# Patient Record
Sex: Female | Born: 1955 | Race: White | Hispanic: No | Marital: Married | Smoking: Never smoker
Health system: Southern US, Community
[De-identification: ages and names within clinical notes are randomized; demographics above are authoritative.]

## PROBLEM LIST (undated history)

## (undated) DIAGNOSIS — I10 Essential (primary) hypertension: Secondary | ICD-10-CM

## (undated) DIAGNOSIS — E119 Type 2 diabetes mellitus without complications: Secondary | ICD-10-CM

---

## 2019-11-29 ENCOUNTER — Telehealth: Payer: Self-pay | Admitting: Family Medicine

## 2019-11-29 ENCOUNTER — Emergency Department (INDEPENDENT_AMBULATORY_CARE_PROVIDER_SITE_OTHER)
Admission: EM | Admit: 2019-11-29 | Discharge: 2019-11-29 | Disposition: A | Discharge status: Home / Self Care | Payer: HRSA Program | Source: Home / Self Care

## 2019-11-29 ENCOUNTER — Other Ambulatory Visit: Payer: Self-pay

## 2019-11-29 ENCOUNTER — Emergency Department
Admission: RE | Admit: 2019-11-29 | Discharge: 2019-11-29 | Disposition: A | Discharge status: Home / Self Care | Payer: Self-pay | Source: Ambulatory Visit

## 2019-11-29 DIAGNOSIS — U071 COVID-19: Secondary | ICD-10-CM

## 2019-11-29 DIAGNOSIS — J4520 Mild intermittent asthma, uncomplicated: Secondary | ICD-10-CM

## 2019-11-29 MED ORDER — ALBUTEROL SULFATE (2.5 MG/3ML) 0.083% IN NEBU: 2.5000 mg | INHALATION_SOLUTION | Freq: Four times a day (QID) | RESPIRATORY_TRACT | 12 refills | Status: AC | PRN

## 2019-11-29 MED ORDER — ALBUTEROL SULFATE (5 MG/ML) 0.5% IN NEBU: 2.5000 mg | INHALATION_SOLUTION | Freq: Four times a day (QID) | RESPIRATORY_TRACT | 12 refills | Status: DC | PRN

## 2019-11-29 MED ORDER — PROMETHAZINE-CODEINE 6.25-10 MG/5ML PO SYRP: 5.0000 mL | ORAL_SOLUTION | Freq: Four times a day (QID) | ORAL | 0 refills | Status: DC | PRN

## 2019-11-29 MED ORDER — PROMETHAZINE-CODEINE 6.25-10 MG/5ML PO SYRP: 5.0000 mL | ORAL_SOLUTION | Freq: Four times a day (QID) | ORAL | 0 refills | Status: AC | PRN

## 2019-11-29 MED ORDER — DEXAMETHASONE SODIUM PHOSPHATE 10 MG/ML IJ SOLN
10.0000 mg | Freq: Once | INTRAMUSCULAR | Status: AC
  Administered 2019-11-29: 10 mg via INTRAMUSCULAR

## 2019-11-29 NOTE — Telephone Encounter (Signed)
Reordered albuterol in ampules initially ordered as bottle.

## 2019-11-29 NOTE — Discharge Instructions (Addendum)
You will be contacted by someone at the [facility] health infusion clinic regarding setting for your infusion therapy. In the meantime continue to monitor your oxygen level at home as we discussed if your level drops below 89 consistently this is a medical emergency and he should go immediately to the emergency department.  I sent over medication to you help with cough during the day you can take Delsym.  Hydrate and perform deep breathing exercises are the best way to prevent lung related complications such as pneumonia related to Covid. Recommendation management of viral illness include: Vitamin D 5,000 IU daily Vitamin C 500 mg twice daily Zinc 50 mg daily

## 2019-11-29 NOTE — ED Provider Notes (Addendum)
[facility]    CSN: [id number] Arrival date & time: 11/29/19  1439      History   Chief Complaint Chief Complaint  Patient presents with  . Covid Positive  . Shortness of Breath  . Cough    HPI Candice Clay is a 64 y.o. female.   HPI Patient presents with an active COVID-19 infection presents for evaluation of worsening cough, shortness of breath, and active history of asthma.  She has used her albuterol inhaler frequently with some improvement in work of breathing. However, symptoms have persisted. She is high risk for complications related to COVID-19 due to weight, diabetes, and cardiovascular disease. She experienced low grade fever. Able tolerate oral intake of food and fluids.History reviewed. No pertinent past medical history.  There are no problems to display for this patient.   History reviewed. No pertinent surgical history.  OB History   No obstetric history on file.      Home Medications    Prior to Admission medications   Medication Sig Start Date End Date Taking? Authorizing Provider  metFORMIN (GLUCOPHAGE) 500 MG tablet Take 1 tablet by mouth 2 (two) times daily with a meal. 06/21/17  Yes [provider]    Family History History reviewed. No pertinent family history.  Social History Social History   Tobacco Use  . Smoking status: Never Smoker  . Smokeless tobacco: Never Used  Vaping Use  . Vaping Use: Never used  Substance Use Topics  . Alcohol use: Not on file  . Drug use: Not on file     Allergies   Patient has no known allergies.   Review of Systems Review of Systems Pertinent negatives listed in HPI  Physical Exam Triage Vital Signs ED Triage Vitals  Enc Vitals Group     BP 11/29/19 1458 (!) 147/91     Pulse Rate 11/29/19 1458 97     Resp 11/29/19 1458 17     Temp 11/29/19 1458 99.3 F (37.4 C)     Temp Source 11/29/19 1458 Oral     SpO2 11/29/19 1458 95 %     Weight 11/29/19 1459 236 lb (107 kg)      Height 11/29/19 1459 5\' 5"  (1.651 m)     Head Circumference --      Peak Flow --      Pain Score 11/29/19 1459 0     Pain Loc --      Pain Edu? --      Excl. in GC? --    No data found.  Updated Vital Signs BP (!) 147/91 (BP Location: Right Arm)   Pulse 97   Temp 99.3 F (37.4 C) (Oral)   Resp 17   Ht 5\' 5"  (1.651 m)   Wt 236 lb (107 kg)   SpO2 95%   BMI 39.27 kg/m   Visual Acuity Right Eye Distance:   Left Eye Distance:   Bilateral Distance:    Right Eye Near:   Left Eye Near:    Bilateral Near:     Physical Exam Constitutional:      Appearance: She is obese.  HENT:     Head: Normocephalic.  Eyes:     Extraocular Movements: Extraocular movements intact.  Cardiovascular:     Rate and Rhythm: Normal rate and regular rhythm.  Pulmonary:     Effort: Prolonged expiration present.     Breath sounds: Normal air entry. Examination of the right-upper field reveals rhonchi. Examination of the  left-upper field reveals rhonchi. Examination of the right-middle field reveals rhonchi. Examination of the left-middle field reveals rhonchi. Rhonchi present.  Neurological:     General: No focal deficit present.     Mental Status: She is alert and oriented to person, place, and time. Mental status is at baseline.  Psychiatric:        Attention and Perception: Attention and perception normal.        Mood and Affect: Mood normal.     UC Treatments / Results  Labs (all labs ordered are listed, but only abnormal results are displayed) Labs Reviewed - No data to display  EKG   Radiology No results found.  Procedures Procedures (including critical care time)  Medications Ordered in UC Medications - No data to display  Initial Impression / Assessment and Plan / UC Course  I have reviewed the triage vital signs and the nursing notes.  Pertinent labs & imaging results that were available during my care of the patient were reviewed by me and considered in my medical  decision making (see chart for details).    Patient referred to the [facility] health infusion clinic given high risk for complications related to Covid. Discussed red flags that warrant immediate evaluation in the ER. Albuterol inhaler provided here in clinic today.  Patient given a written prescription for nebulizer medication as patient is without health insurance has not had the medication filled in quite some time.. Encouraged hydration and deep breathing to prevent pneumonia.  Promethazine DM prescribed for cough.  Patient verbalized understanding and agreement with plan. Nebulizer inhaler dispensed here and printed prescription for albuterol inhaler. Final Clinical Impressions(s) / UC Diagnoses   Final diagnoses:  COVID-19 virus infection  Mild intermittent asthma without complication     Discharge Instructions     You will be contacted by someone at the [facility] health infusion clinic regarding setting for your infusion therapy. In the meantime continue to monitor your oxygen level at home as we discussed if your level drops below 89 consistently this is a medical emergency and he should go immediately to the emergency department.  I sent over medication to you help with cough during the day you can take Delsym.  Hydrate and perform deep breathing exercises are the best way to prevent lung related complications such as pneumonia related to Covid. Recommendation management of viral illness include: Vitamin D 5,000 IU daily Vitamin C 500 mg twice daily Zinc 50 mg daily     ED Prescriptions    Medication Sig Dispense Auth. Provider   promethazine-codeine (PHENERGAN WITH CODEINE) 6.25-10 MG/5ML syrup  (Status: Discontinued) Take 5 mLs by mouth every 6 (six) hours as needed for cough. 120 mL Bing Neighbors, FNP   promethazine-codeine (PHENERGAN WITH CODEINE) 6.25-10 MG/5ML syrup Take 5 mLs by mouth every 6 (six) hours as needed for cough. 120 mL Bing Neighbors, FNP   albuterol  (PROVENTIL) (5 MG/ML) 0.5% nebulizer solution Take 0.5 mLs (2.5 mg total) by nebulization every 6 (six) hours as needed for wheezing or shortness of breath. 20 mL Bing Neighbors, FNP     PDMP not reviewed this encounter.   Bing Neighbors, FNP 11/29/19 1847    Bing Neighbors, FNP 11/29/19 2246

## 2019-11-29 NOTE — ED Triage Notes (Signed)
COVID pos pt here today c/o cough and SOB. Husband pos as of last Monday. Pt sxs started last wed. Pos test on Thurs 9/23. Hx of asthma. Takes ventolin prn. Almost empty.   Has not had COVID vaccinations. Education provided.

## 2019-11-30 ENCOUNTER — Other Ambulatory Visit: Payer: Self-pay | Admitting: Internal Medicine

## 2019-11-30 ENCOUNTER — Ambulatory Visit (HOSPITAL_COMMUNITY)
Admission: RE | Admit: 2019-11-30 | Discharge: 2019-11-30 | Disposition: A | Discharge status: Home / Self Care | Payer: HRSA Program | Source: Ambulatory Visit | Attending: Pulmonary Disease | Admitting: Pulmonary Disease

## 2019-11-30 ENCOUNTER — Telehealth: Payer: Self-pay | Admitting: Internal Medicine

## 2019-11-30 ENCOUNTER — Telehealth: Payer: Self-pay | Admitting: Emergency Medicine

## 2019-11-30 DIAGNOSIS — E119 Type 2 diabetes mellitus without complications: Secondary | ICD-10-CM | POA: Diagnosis present

## 2019-11-30 DIAGNOSIS — U071 COVID-19: Secondary | ICD-10-CM

## 2019-11-30 DIAGNOSIS — J4520 Mild intermittent asthma, uncomplicated: Secondary | ICD-10-CM

## 2019-11-30 MED ORDER — EPINEPHRINE 0.3 MG/0.3ML IJ SOAJ: 0.3000 mg | Freq: Once | INTRAMUSCULAR | Status: DC | PRN

## 2019-11-30 MED ORDER — SODIUM CHLORIDE 0.9 % IV SOLN
1200.0000 mg | Freq: Once | INTRAVENOUS | Status: AC
  Administered 2019-11-30: 1200 mg via INTRAVENOUS

## 2019-11-30 MED ORDER — METHYLPREDNISOLONE SODIUM SUCC 125 MG IJ SOLR: 125.0000 mg | Freq: Once | INTRAMUSCULAR | Status: DC | PRN

## 2019-11-30 MED ORDER — ALBUTEROL SULFATE HFA 108 (90 BASE) MCG/ACT IN AERS: 2.0000 | INHALATION_SPRAY | Freq: Once | RESPIRATORY_TRACT | Status: DC | PRN

## 2019-11-30 MED ORDER — FAMOTIDINE IN NACL 20-0.9 MG/50ML-% IV SOLN: 20.0000 mg | Freq: Once | INTRAVENOUS | Status: DC | PRN

## 2019-11-30 MED ORDER — SODIUM CHLORIDE 0.9 % IV SOLN: INTRAVENOUS | Status: DC | PRN

## 2019-11-30 MED ORDER — DIPHENHYDRAMINE HCL 50 MG/ML IJ SOLN: 50.0000 mg | Freq: Once | INTRAMUSCULAR | Status: DC | PRN

## 2019-11-30 NOTE — Telephone Encounter (Signed)
Call the patient and left a message, asked for a call back to: [phone] to learn more about the infusion and schedule an appointment.

## 2019-11-30 NOTE — Discharge Instructions (Signed)

## 2019-11-30 NOTE — Telephone Encounter (Signed)
I spoke with the patient. Reports a history of asthma and diabetes. Started with symptoms November 22, 2019: She tested positive. Went to urgent care recently, got steroids. Still have symptoms including cough and difficulty breathing. Pro/cons of infusion discussed and she agreed to proceed. Orders are entered, detailed instructions discussed.

## 2019-11-30 NOTE — Telephone Encounter (Signed)
Call back to pharmacy regarding changing nebulizer solution to individual vials. Spoke w/pharmacist. Prescription had been corrected last night electronically & was filled.

## 2019-11-30 NOTE — Progress Notes (Signed)
I connected by phone with Candice Clay on 11/30/2019 at 12:18 PM to discuss the potential use of a new treatment for mild to moderate COVID-19 viral infection in non-hospitalized patients.  This patient is a 64 y.o. female that meets the FDA criteria for Emergency Use Authorization of COVID monoclonal antibody casirivimab/imdevimab or bamlanivimab/eteseviamb.  Has a (+) direct SARS-CoV-2 viral test result  Has mild or moderate COVID-19   Is NOT hospitalized due to COVID-19  Is within 10 days of symptom onset  Has at least one of the high risk factor(s) for progression to severe COVID-19 and/or hospitalization as defined in EUA.  Specific high risk criteria : Older age (>/= 66 yo), Diabetes and Chronic Lung Disease   I have spoken and communicated the following to the patient or parent/caregiver regarding COVID monoclonal antibody treatment:  1. FDA has authorized the emergency use for the treatment of mild to moderate COVID-19 in adults and pediatric patients with positive results of direct SARS-CoV-2 viral testing who are 17 years of age and older weighing at least 40 kg, and who are at high risk for progressing to severe COVID-19 and/or hospitalization.  2. The significant known and potential risks and benefits of COVID monoclonal antibody, and the extent to which such potential risks and benefits are unknown.  3. Information on available alternative treatments and the risks and benefits of those alternatives, including clinical trials.  4. Patients treated with COVID monoclonal antibody should continue to self-isolate and use infection control measures (e.g., wear mask, isolate, social distance, avoid sharing personal items, clean and disinfect "high touch" surfaces, and frequent handwashing) according to CDC guidelines.   5. The patient or parent/caregiver has the option to accept or refuse COVID monoclonal antibody treatment.  After reviewing this information with the patient, the  patient has agreed to receive one of the available covid 19 monoclonal antibodies and will be provided an appropriate fact sheet prior to infusion. Candice Ora, MD 11/30/2019 12:18 PM

## 2019-11-30 NOTE — Progress Notes (Signed)
  Diagnosis: COVID-19  Physician: Dr. Wright  Procedure: Covid Infusion Clinic Med: casirivimab\imdevimab infusion - Provided patient with casirivimab\imdevimab fact sheet for patients, parents and caregivers prior to infusion.  Complications: No immediate complications noted.  Discharge: Discharged home   [name] 11/30/2019  

## 2020-12-22 ENCOUNTER — Other Ambulatory Visit: Payer: Self-pay

## 2020-12-22 ENCOUNTER — Encounter (HOSPITAL_BASED_OUTPATIENT_CLINIC_OR_DEPARTMENT_OTHER): Payer: Self-pay | Admitting: Obstetrics and Gynecology

## 2020-12-22 ENCOUNTER — Emergency Department (HOSPITAL_BASED_OUTPATIENT_CLINIC_OR_DEPARTMENT_OTHER): Payer: Medicare Other

## 2020-12-22 ENCOUNTER — Emergency Department (HOSPITAL_BASED_OUTPATIENT_CLINIC_OR_DEPARTMENT_OTHER)
Admission: EM | Admit: 2020-12-22 | Discharge: 2020-12-22 | Disposition: A | Discharge status: Home / Self Care | Payer: Medicare Other | Attending: Emergency Medicine | Admitting: Emergency Medicine

## 2020-12-22 DIAGNOSIS — Z7984 Long term (current) use of oral hypoglycemic drugs: Secondary | ICD-10-CM | POA: Insufficient documentation

## 2020-12-22 DIAGNOSIS — E119 Type 2 diabetes mellitus without complications: Secondary | ICD-10-CM | POA: Insufficient documentation

## 2020-12-22 DIAGNOSIS — I10 Essential (primary) hypertension: Secondary | ICD-10-CM | POA: Insufficient documentation

## 2020-12-22 DIAGNOSIS — M62838 Other muscle spasm: Secondary | ICD-10-CM

## 2020-12-22 DIAGNOSIS — R519 Headache, unspecified: Secondary | ICD-10-CM | POA: Diagnosis present

## 2020-12-22 HISTORY — DX: Essential (primary) hypertension: I10

## 2020-12-22 HISTORY — DX: Type 2 diabetes mellitus without complications: E11.9

## 2020-12-22 MED ORDER — CYCLOBENZAPRINE HCL 10 MG PO TABS
10.0000 mg | ORAL_TABLET | Freq: Once | ORAL | Status: AC
  Administered 2020-12-22: 10 mg via ORAL
  Filled 2020-12-22: qty 1

## 2020-12-22 MED ORDER — METHYLPREDNISOLONE 4 MG PO TBPK: ORAL_TABLET | ORAL | 0 refills | Status: AC

## 2020-12-22 MED ORDER — CYCLOBENZAPRINE HCL 10 MG PO TABS: 10.0000 mg | ORAL_TABLET | Freq: Two times a day (BID) | ORAL | 0 refills | Status: AC | PRN

## 2020-12-22 MED ORDER — KETOROLAC TROMETHAMINE 60 MG/2ML IM SOLN
30.0000 mg | Freq: Once | INTRAMUSCULAR | Status: AC
  Administered 2020-12-22: 30 mg via INTRAMUSCULAR
  Filled 2020-12-22: qty 2

## 2020-12-22 NOTE — ED Notes (Signed)
Dr Curatolo in room w/pt now. 

## 2020-12-22 NOTE — Discharge Instructions (Addendum)
Overall suspect you have a muscle spasm in your neck.  Take muscle relaxant as prescribed but do not do any dangerous activities including driving while taking this medicine as it is sedating.  Take steroid Dosepak as prescribed.  Incidentally were found to have a meningioma.  Follow-up with neurosurgery to see if there is any further work-up/what monitoring needs to be done.  I have referred you to Dr. Maisie Fus for this.  Follow-up with your primary care doctor as well.  Continue to take Tylenol and ibuprofen as needed.

## 2020-12-22 NOTE — ED Provider Notes (Signed)
[facility] EMERGENCY DEPT Provider Note   CSN: [id number] Arrival date & time: 12/22/20  1125     History Chief Complaint  Patient presents with   Neck Pain   Headache    Candice Clay is a 65 y.o. female.  The history is provided by the patient.  Headache Pain location:  Generalized Radiates to:  R neck Onset quality:  Gradual Duration: weeks. Timing:  Intermittent Progression:  Waxing and waning Chronicity:  New Relieved by:  Nothing Worsened by:  Neck movement Associated symptoms: neck pain and neck stiffness   Associated symptoms: no abdominal pain, no back pain, no cough, no dizziness, no ear pain, no eye pain, no fever, no numbness, no seizures, no sore throat, no vomiting and no weakness       Past Medical History:  Diagnosis Date   Diabetes mellitus without complication (HCC)    Hypertension     There are no problems to display for this patient.   History reviewed. No pertinent surgical history.   OB History     Gravida      Para      Term      Preterm      AB      Living  3      SAB      IAB      Ectopic      Multiple      Live Births              No family history on file.  Social History   Tobacco Use   Smoking status: Never    Passive exposure: Never   Smokeless tobacco: Never  Vaping Use   Vaping Use: Never used  Substance Use Topics   Alcohol use: Never   Drug use: Never    Home Medications Prior to Admission medications   Medication Sig Start Date End Date Taking? Authorizing Provider  cyclobenzaprine (FLEXERIL) 10 MG tablet Take 1 tablet (10 mg total) by mouth 2 (two) times daily as needed for muscle spasms. 12/22/20  Yes [place], DO  methylPREDNISolone (MEDROL DOSEPAK) 4 MG TBPK tablet Follow package insert 12/22/20  Yes Donnelle Rubey, DO  albuterol (PROVENTIL) (2.5 MG/3ML) 0.083% nebulizer solution Take 3 mLs (2.5 mg total) by nebulization every 6 (six) hours as needed for  wheezing or shortness of breath. 11/29/19   Bing Neighbors, FNP  metFORMIN (GLUCOPHAGE) 500 MG tablet Take 1 tablet by mouth 2 (two) times daily with a meal. 06/21/17   [provider]  promethazine-codeine (PHENERGAN WITH CODEINE) 6.25-10 MG/5ML syrup Take 5 mLs by mouth every 6 (six) hours as needed for cough. 11/29/19   Bing Neighbors, FNP    Allergies    Patient has no known allergies.  Review of Systems   Review of Systems  Constitutional:  Negative for chills and fever.  HENT:  Negative for ear pain and sore throat.   Eyes:  Negative for pain and visual disturbance.  Respiratory:  Negative for cough and shortness of breath.   Cardiovascular:  Negative for chest pain and palpitations.  Gastrointestinal:  Negative for abdominal pain and vomiting.  Genitourinary:  Negative for dysuria and hematuria.  Musculoskeletal:  Positive for neck pain and neck stiffness. Negative for arthralgias and back pain.  Skin:  Negative for color change and rash.  Neurological:  Positive for headaches. Negative for dizziness, tremors, seizures, syncope, facial asymmetry, speech difficulty, weakness, light-headedness and numbness.  All other systems  reviewed and are negative.  Physical Exam Updated Vital Signs BP (!) 184/112 (BP Location: Right Arm) Comment: pt states she has not taken her HTN meds x3 days  Pulse 68   Temp 97.9 F (36.6 C) (Oral)   Resp 16   SpO2 99%   Physical Exam Vitals and nursing note reviewed.  Constitutional:      General: She is not in acute distress.    Appearance: She is well-developed. She is not ill-appearing.  HENT:     Head: Normocephalic and atraumatic.     Mouth/Throat:     Mouth: Mucous membranes are moist.  Eyes:     Extraocular Movements: Extraocular movements intact.     Conjunctiva/sclera: Conjunctivae normal.     Pupils: Pupils are equal, round, and reactive to light.  Cardiovascular:     Rate and Rhythm: Normal rate and regular rhythm.      Pulses: Normal pulses.     Heart sounds: Normal heart sounds. No murmur heard. Pulmonary:     Effort: Pulmonary effort is normal. No respiratory distress.     Breath sounds: Normal breath sounds.  Abdominal:     Palpations: Abdomen is soft.     Tenderness: There is no abdominal tenderness.  Musculoskeletal:        General: Tenderness present. Normal range of motion.     Cervical back: Normal range of motion and neck supple.     Comments: Tenderness to right trapezius area, right paraspinal cervical muscles  Skin:    General: Skin is warm and dry.     Capillary Refill: Capillary refill takes less than 2 seconds.  Neurological:     General: No focal deficit present.     Mental Status: She is alert and oriented to person, place, and time.     Cranial Nerves: No cranial nerve deficit.     Sensory: No sensory deficit.     Motor: No weakness.     Coordination: Coordination normal.     Gait: Gait normal.     Comments: 5+ out of 5 strength throughout, normal sensation, no drift, normal finger-nose-finger, normal speech, no visual field deficit    ED Results / Procedures / Treatments   Labs (all labs ordered are listed, but only abnormal results are displayed) Labs Reviewed - No data to display  EKG None  Radiology CT Head Wo Contrast  Result Date: 12/22/2020 CLINICAL DATA:  Headache.  Neck pain. EXAM: CT HEAD WITHOUT CONTRAST TECHNIQUE: Contiguous axial images were obtained from the base of the skull through the vertex without intravenous contrast. COMPARISON:  None. FINDINGS: Brain: No evidence of acute infarction, hemorrhage, hydrocephalus, extra-axial collection or mass lesion/mass effect. There is a calcified meningioma overlying the left frontal lobe measuring 1.3 x 0.9 cm, image 23/2. Vascular: No hyperdense vessel or unexpected calcification. Skull: Normal. Negative for fracture or focal lesion. Sinuses/Orbits: Mild mucosal thickening identified within the left maxillary  sinus. Retention cyst versus polyp is also noted in the left maxillary sinus measuring 1.5 cm. Other: None IMPRESSION: 1. No acute intracranial abnormalities. 2. Calcified meningioma overlying the left frontal lobe. 3. Left maxillary sinus inflammation. Electronically Signed   By: Signa Kell M.D.   On: 12/22/2020 12:29   CT Cervical Spine Wo Contrast  Result Date: 12/22/2020 CLINICAL DATA:  Pain for the last few months. EXAM: CT CERVICAL SPINE WITHOUT CONTRAST TECHNIQUE: Multidetector CT imaging of the cervical spine was performed without intravenous contrast. Multiplanar CT image reconstructions were also generated. COMPARISON:  None. FINDINGS: Alignment: Normal. Skull base and vertebrae: The vertebral body heights are well preserved. The facet joints are all aligned. No fractures identified. Soft tissues and spinal canal: No prevertebral fluid or swelling. No visible canal hematoma. Disc levels: Multilevel disc space narrowing and ventral endplate spurring is identified. This is most advanced at C4-5, C5-6 and C6-7. Upper chest: Negative. Other: None IMPRESSION: 1. No evidence for cervical spine fracture. 2. Cervical degenerative disc disease. Electronically Signed   By: Signa Kell M.D.   On: 12/22/2020 12:32    Procedures Procedures   Medications Ordered in ED Medications  ketorolac (TORADOL) injection 30 mg (30 mg Intramuscular Given 12/22/20 1320)  cyclobenzaprine (FLEXERIL) tablet 10 mg (10 mg Oral Given 12/22/20 1318)    ED Course  I have reviewed the triage vital signs and the nursing notes.  Pertinent labs & imaging results that were available during my care of the patient were reviewed by me and considered in my medical decision making (see chart for details).    MDM Rules/Calculators/A&P                           Candice Clay is here for evaluation of right-sided neck pain, headache.  History of hypertension and diabetes.  Overall mild hypertension but otherwise  unremarkable vitals.  Hypertension likely secondary to acute pain.  She has been having right-sided neck pain and stiffness with pain radiating into her head for the last several weeks to months.  Denies any weakness or numbness in her extremities.  No vision changes.  No fevers or chills.  Denies any trauma.  Attributes pain likely from body positioning while working.  She has reproducible tenderness in the right paraspinal cervical muscles as well as right trapezius.  She has normal strength and sensation throughout.  Neuro exam is normal.  She has not had any imaging since this pain is started.  We will get a head and neck CT to evaluate for mass or other structural abnormality.  My suspicion that this is muscular.  Normal pulses in her upper extremities.  No chest pain or shortness of breath.  Head and neck CT unremarkable.  Overall suspect muscular process.  Discharged in good condition.  This chart was dictated using voice recognition software.  Despite best efforts to proofread,  errors can occur which can change the documentation meaning.   Final Clinical Impression(s) / ED Diagnoses Final diagnoses:  Nonintractable headache, unspecified chronicity pattern, unspecified headache type  Neck muscle spasm    Rx / DC Orders ED Discharge Orders          Ordered    methylPREDNISolone (MEDROL DOSEPAK) 4 MG TBPK tablet        12/22/20 1245    cyclobenzaprine (FLEXERIL) 10 MG tablet  2 times daily PRN        12/22/20 1245             Virgina Norfolk, DO 12/23/20 [phone]

## 2020-12-22 NOTE — ED Triage Notes (Signed)
Patient reports for the last few months she is has been having increasingly bad neck pain that has moved up into her head. Patient states she feels like the whole right side of her head and face are swollen and everything has shifted to that side causing severe pain with moving her head or neck

## 2022-11-13 IMAGING — CT CT CERVICAL SPINE W/O CM
3 of 4 series · 12 of 33 positions shown, 14 images · non-contrast
Comparison: None.

CLINICAL DATA: Pain for the last few months.

EXAM:
CT CERVICAL SPINE WITHOUT CONTRAST
TECHNIQUE: Multidetector CT imaging of the cervical spine was performed without
intravenous contrast. Multiplanar CT image reconstructions were also
generated.

[Series 6: cor bone · coronal · 0.40mm/px · 3 of 62 slices shown]
[im 13/62  bone]
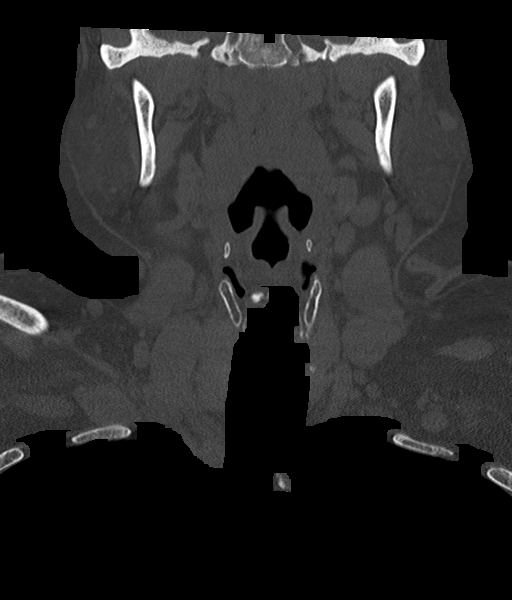
[im 25/62  bone]
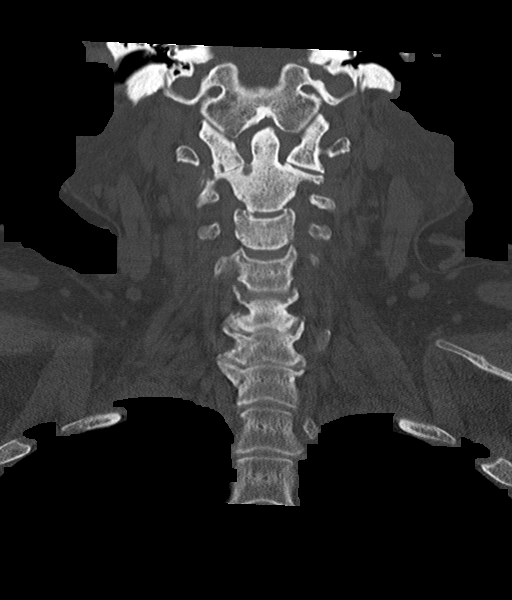
[im 37/62  bone]
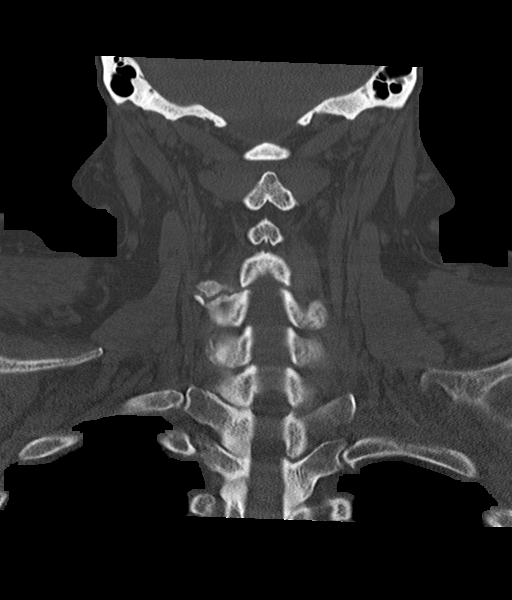

[Series 7: sag bone · sagittal · 0.24mm/px · 5 of 64 slices shown, 6 images]
[im 22/64  bone]
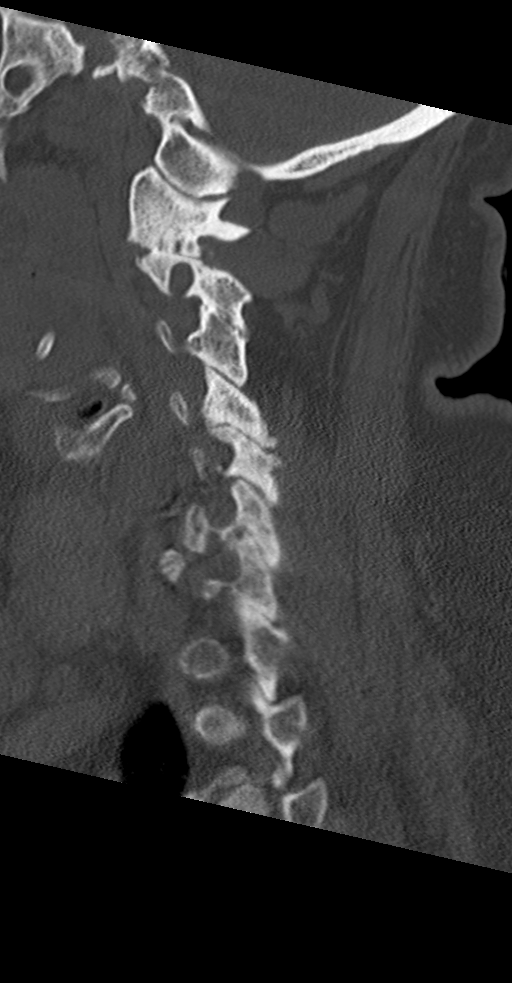
[im 27/64  bone]
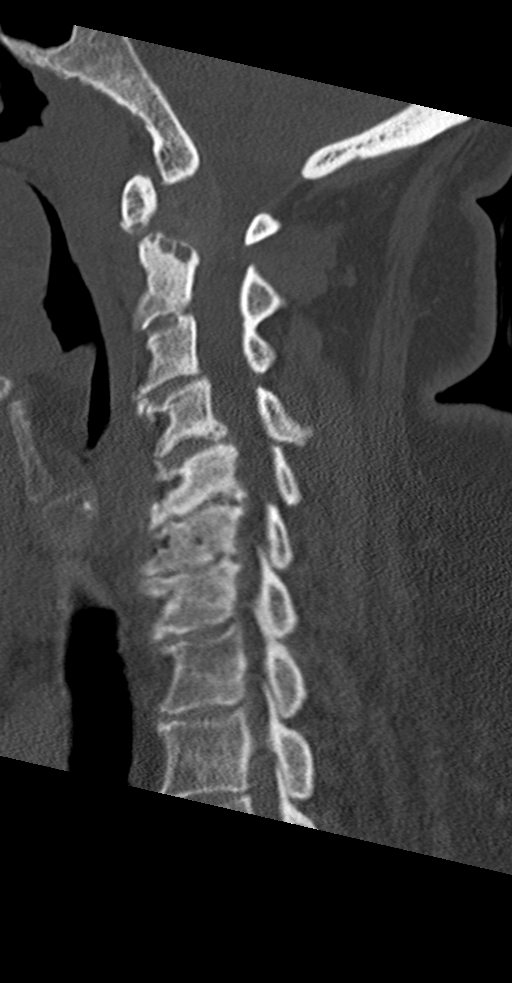
[im 32/64  soft-tissue]
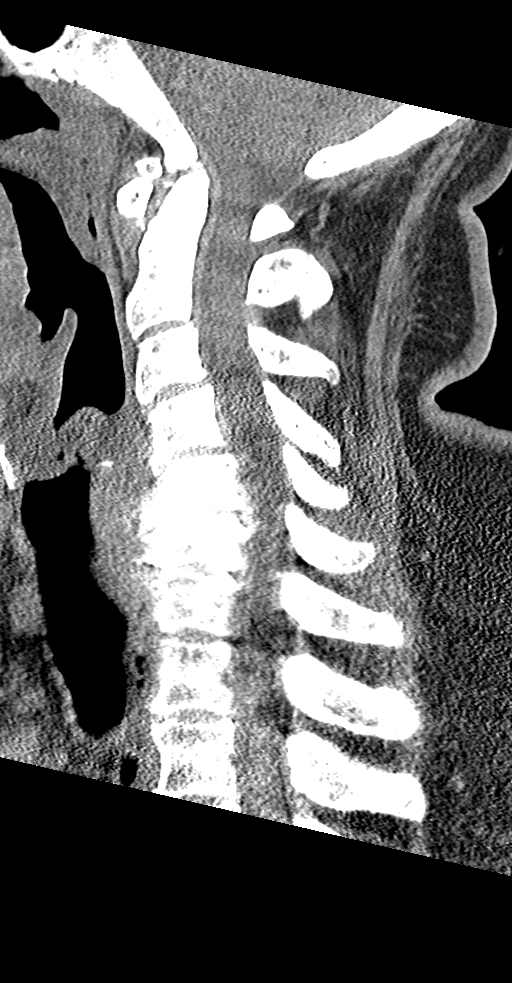
[im 32/64  bone]
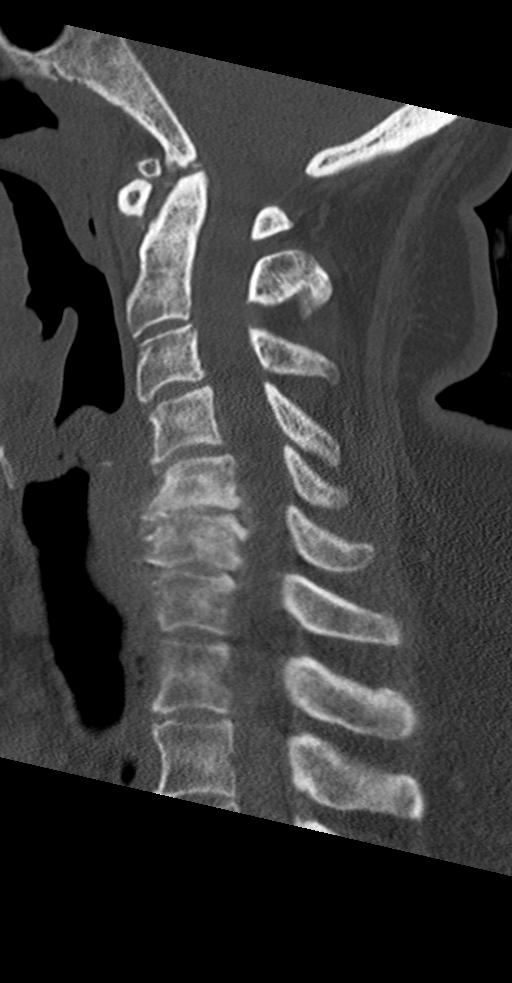
[im 37/64  bone]
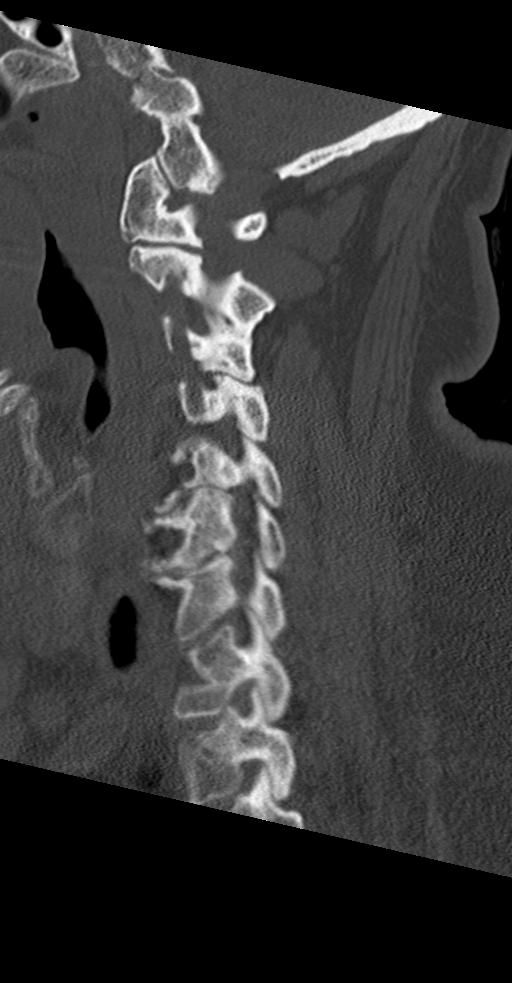
[im 43/64  bone]
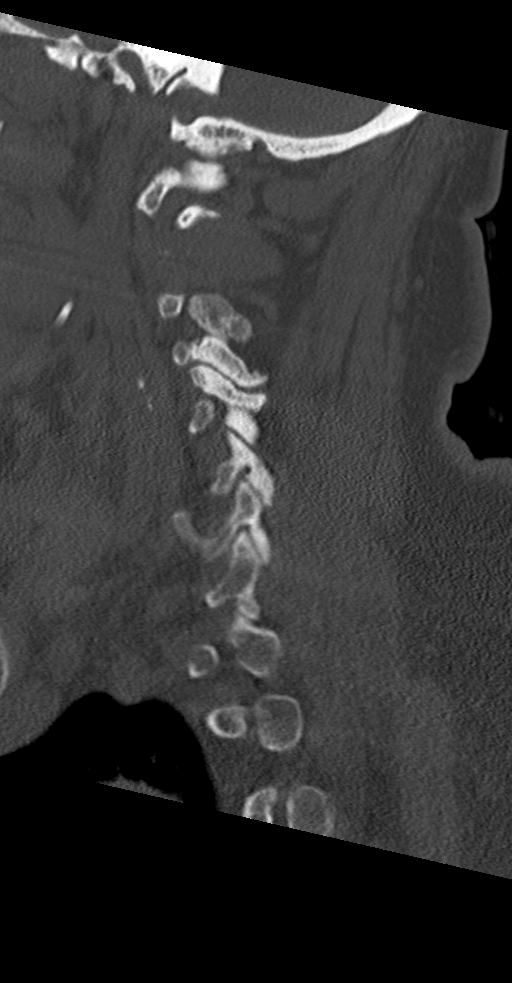

[Series 8: orthogonal axials · axial · 0.21mm/px · z∈[-336,-216]mm · 4 of 93 slices shown, 5 images]
[im 16/93  soft-tissue]
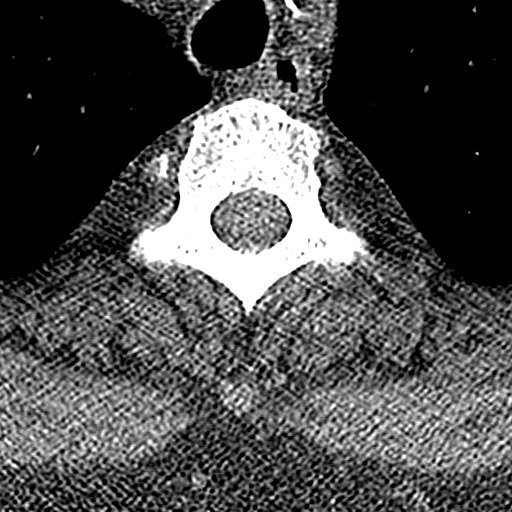
[im 16/93  bone]
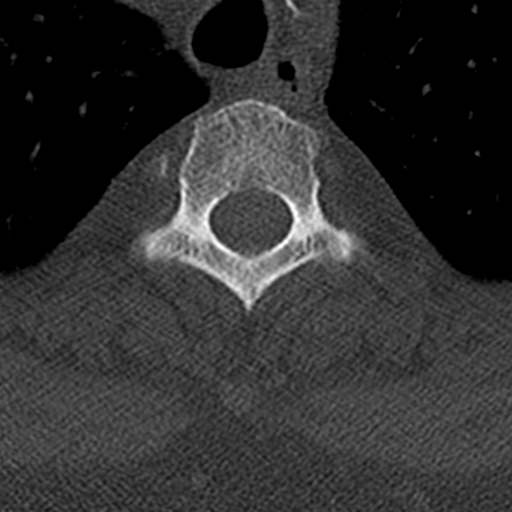
[im 31/93  bone]
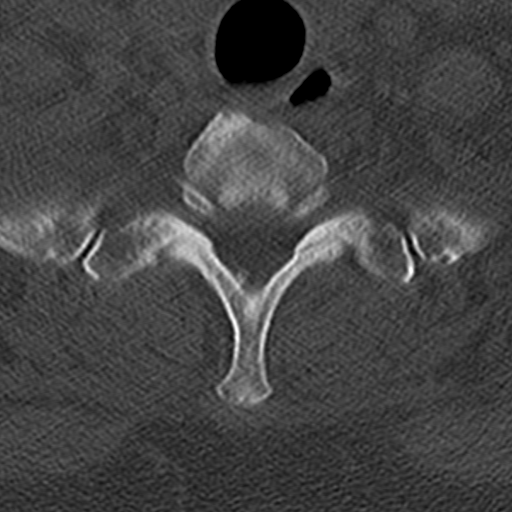
[im 62/93  bone]
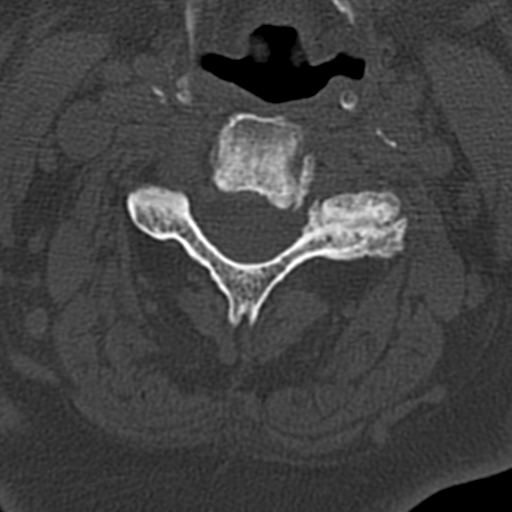
[im 77/93  bone]
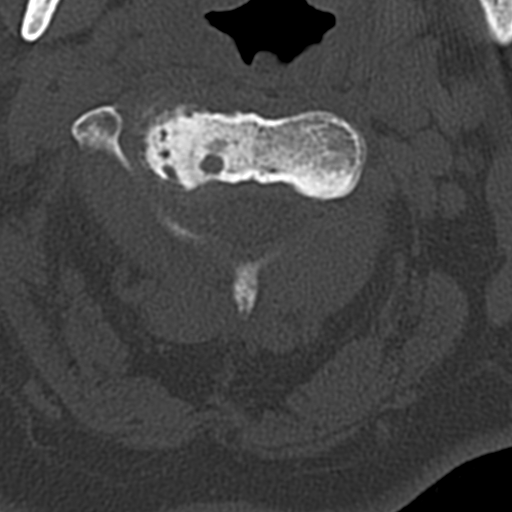

[12 of 33 positions shown; findings below may reference images not displayed]

FINDINGS: Alignment: Normal.

Skull base and vertebrae: The vertebral body heights are well
preserved. The facet joints are all aligned. No fractures
identified.

Soft tissues and spinal canal: No prevertebral fluid or swelling. No
visible canal hematoma.

Disc levels: Multilevel disc space narrowing and ventral endplate
spurring is identified. This is most advanced at C4-5, C5-6 and
C6-7.

Upper chest: Negative.

Other: None
IMPRESSION: 1. No evidence for cervical spine fracture.
2. Cervical degenerative disc disease.

## 2022-11-13 IMAGING — CT CT HEAD W/O CM
4 series · 16 of 47 positions shown, 18 images · non-contrast
Comparison: None.

CLINICAL DATA: Headache.  Neck pain.

EXAM:
CT HEAD WITHOUT CONTRAST
TECHNIQUE: Contiguous axial images were obtained from the base of the skull
through the vertex without intravenous contrast.

[Series 2: head wo · axial · 0.46mm/px · z∈[-152,-32]mm · 7 of 32 slices shown, 9 images]
[im 4/32  brain]
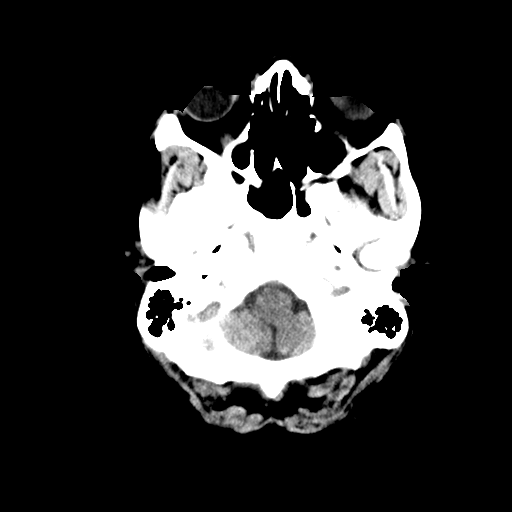
[im 4/32  bone]
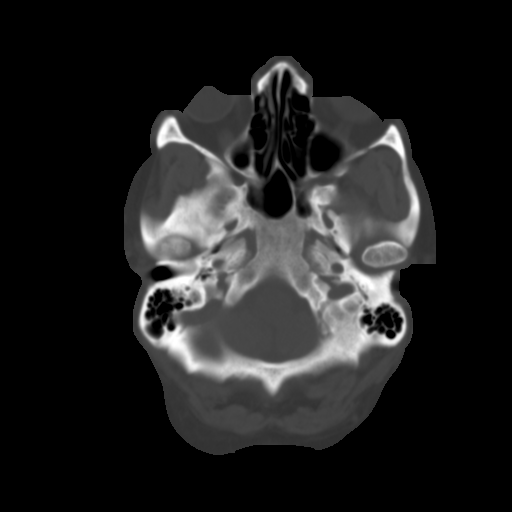
[im 8/32  brain]
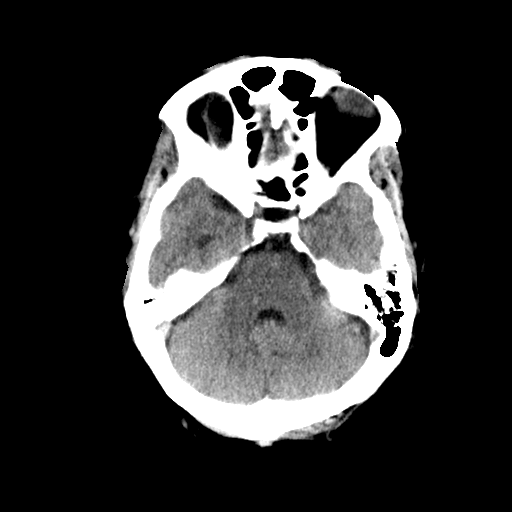
[im 12/32  brain]
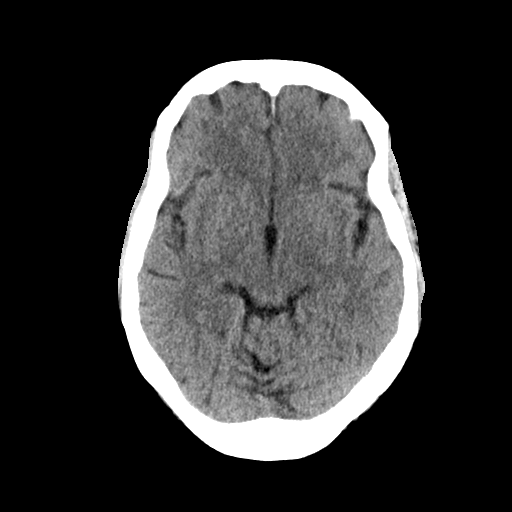
[im 16/32  brain]
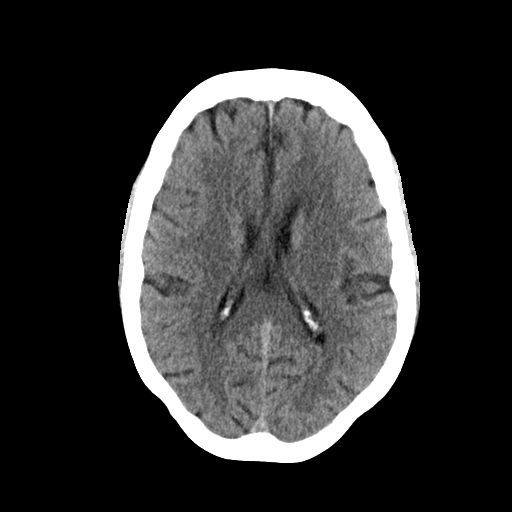
[im 20/32  brain]
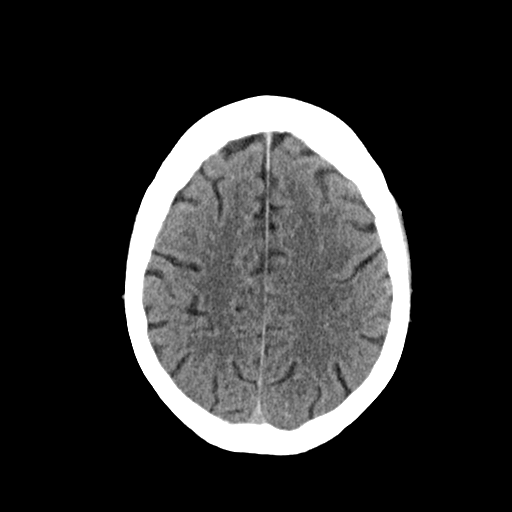
[im 20/32  bone]
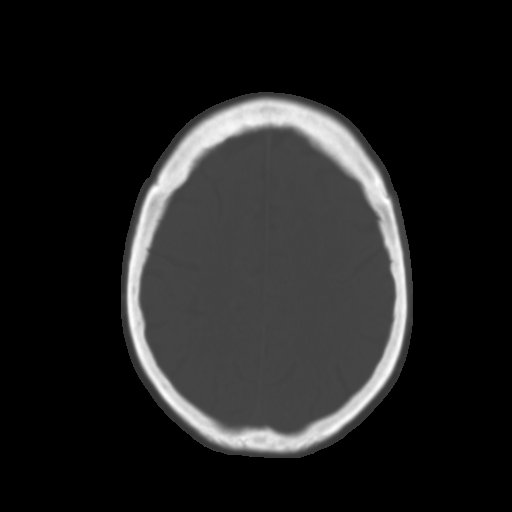
[im 24/32  brain]
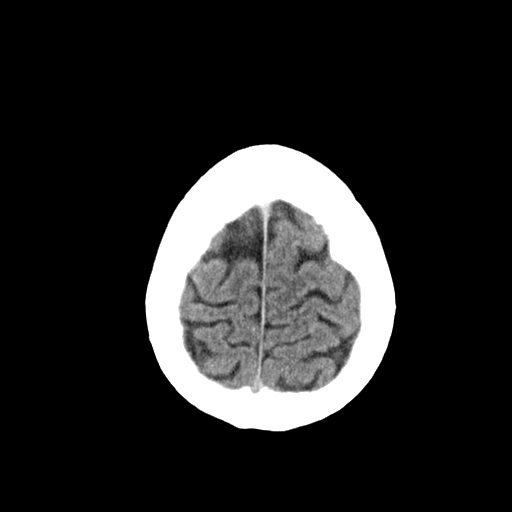
[im 28/32  brain]
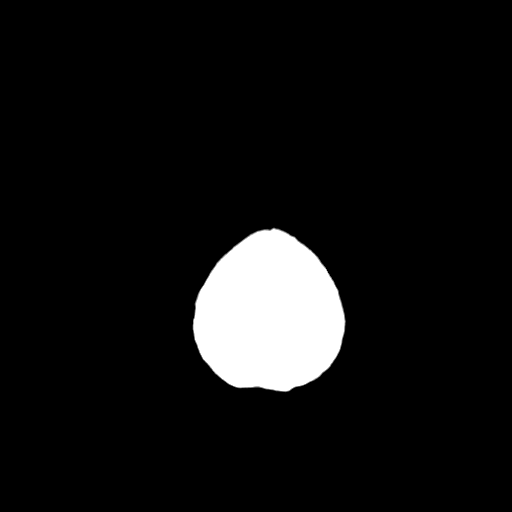

[Series 3: head bone · axial · 0.46mm/px · z∈[-153,-121]mm · 3 of 80 slices shown]
[im 8/80  bone]
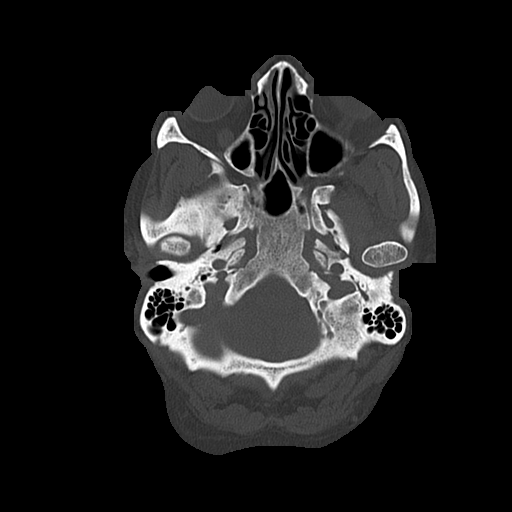
[im 16/80  bone]
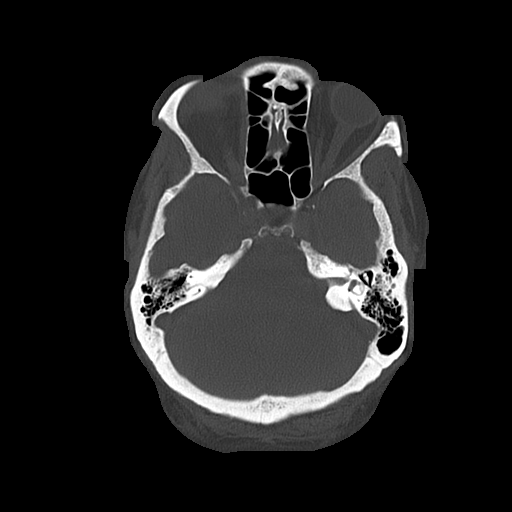
[im 24/80  bone]
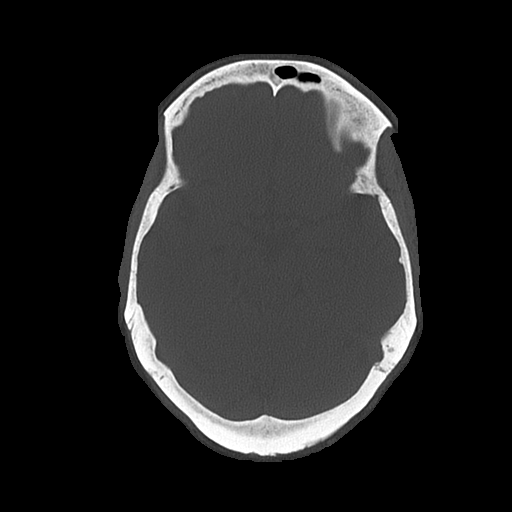

[Series 4: coronal soft · coronal · 0.33mm/px · 3 of 69 slices shown]
[im 23/69  brain]
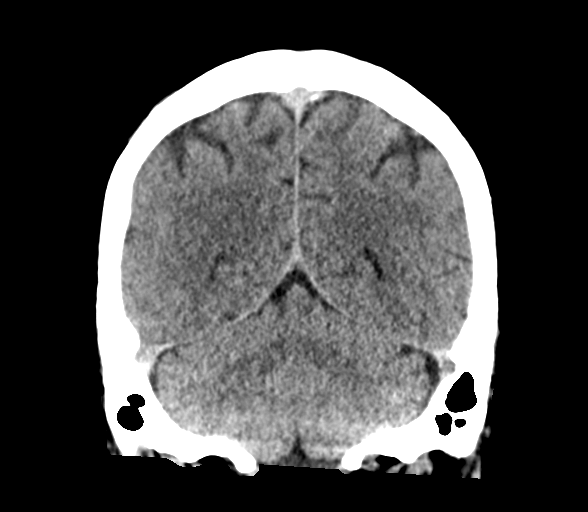
[im 31/69  brain]
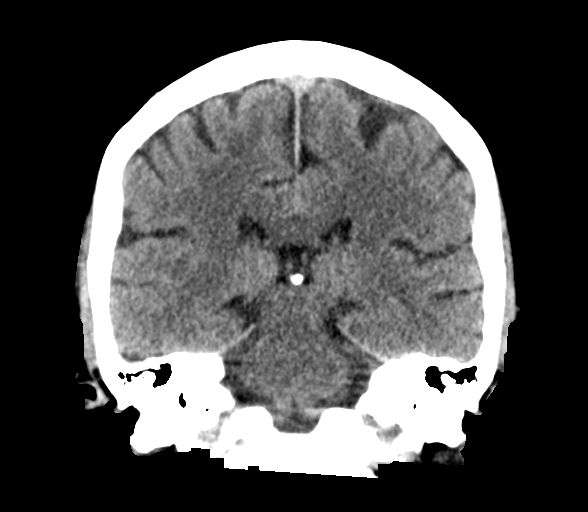
[im 38/69  brain]
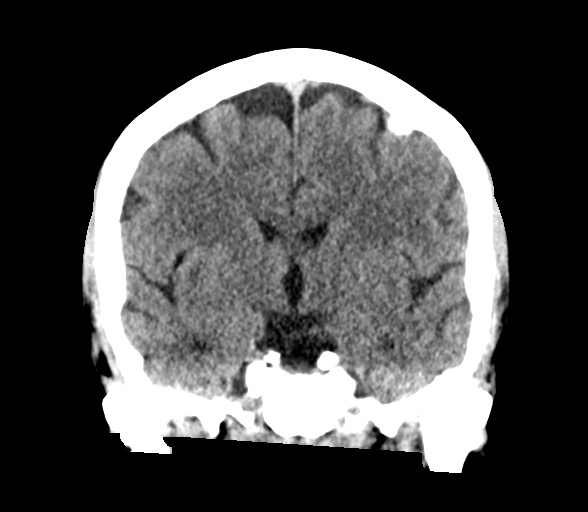

[Series 5: sagittal soft · sagittal · 0.33mm/px · 3 of 59 slices shown]
[im 20/59  brain]
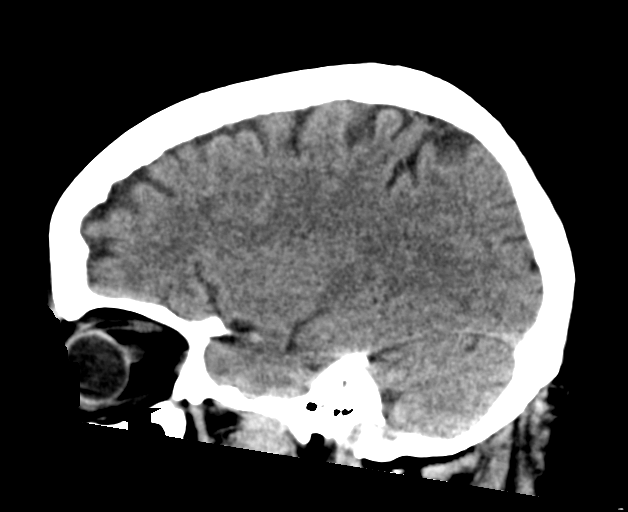
[im 30/59  brain]
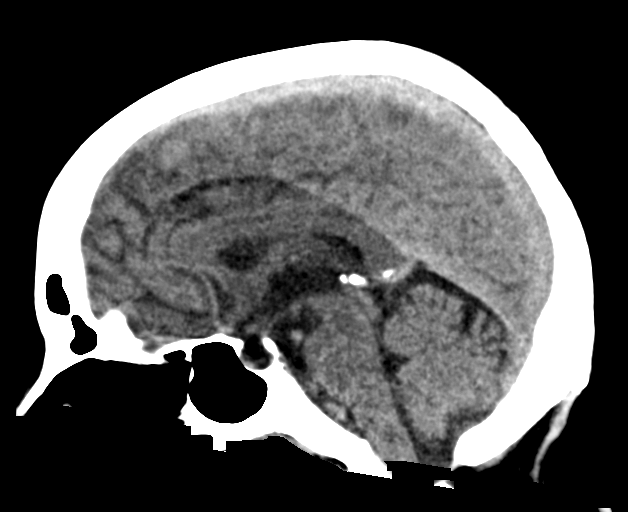
[im 39/59  brain]
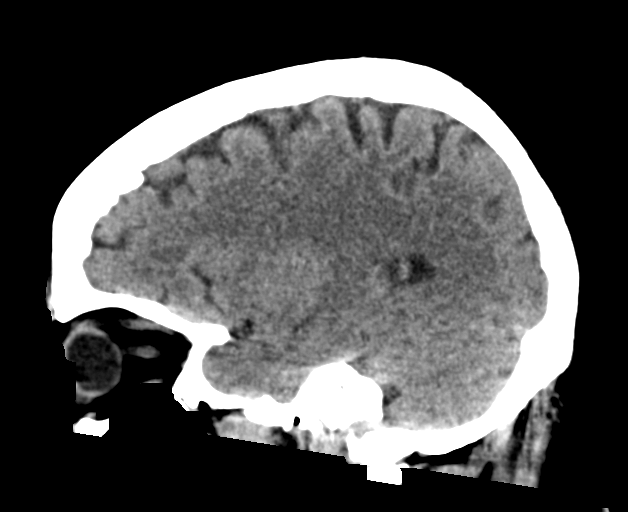

[16 of 47 positions shown; findings below may reference images not displayed]

FINDINGS: Brain: No evidence of acute infarction, hemorrhage, hydrocephalus,
extra-axial collection or mass lesion/mass effect.

There is a calcified meningioma overlying the left frontal lobe
measuring 1.3 x 0.9 cm, image [DATE].

Vascular: No hyperdense vessel or unexpected calcification.

Skull: Normal. Negative for fracture or focal lesion.

Sinuses/Orbits: Mild mucosal thickening identified within the left
maxillary sinus. Retention cyst versus polyp is also noted in the
left maxillary sinus measuring 1.5 cm.

Other: None
IMPRESSION: 1. No acute intracranial abnormalities.
2. Calcified meningioma overlying the left frontal lobe.
3. Left maxillary sinus inflammation.
